# Patient Record
Sex: Female | Born: 1955 | Race: White | Hispanic: No
Health system: Southern US, Community
[De-identification: ages and names within clinical notes are randomized; demographics above are authoritative.]

---

## 1998-03-14 ENCOUNTER — Encounter: Admission: RE | Admit: 1998-03-14 | Discharge: 1998-06-12 | Payer: Self-pay | Admitting: Anesthesiology

## 1998-04-23 ENCOUNTER — Encounter: Admission: RE | Admit: 1998-04-23 | Discharge: 1998-04-23 | Payer: Self-pay | Admitting: Internal Medicine

## 1998-06-28 ENCOUNTER — Encounter: Admission: RE | Admit: 1998-06-28 | Discharge: 1998-09-14 | Payer: Self-pay | Admitting: Anesthesiology

## 1998-09-14 ENCOUNTER — Encounter: Admission: RE | Admit: 1998-09-14 | Discharge: 1998-12-13 | Payer: Self-pay | Admitting: Anesthesiology

## 1998-10-15 ENCOUNTER — Ambulatory Visit (HOSPITAL_COMMUNITY): Admission: RE | Admit: 1998-10-15 | Discharge: 1998-10-15 | Payer: Self-pay | Admitting: Anesthesiology

## 1998-12-13 ENCOUNTER — Encounter: Admission: RE | Admit: 1998-12-13 | Discharge: 1999-03-08 | Payer: Self-pay | Admitting: Anesthesiology

## 1999-03-08 ENCOUNTER — Encounter: Admission: RE | Admit: 1999-03-08 | Discharge: 1999-05-30 | Payer: Self-pay | Admitting: Anesthesiology

## 1999-05-30 ENCOUNTER — Encounter: Admission: RE | Admit: 1999-05-30 | Discharge: 1999-08-23 | Payer: Self-pay | Admitting: Anesthesiology

## 1999-07-04 ENCOUNTER — Encounter: Admission: RE | Admit: 1999-07-04 | Discharge: 1999-07-04 | Payer: Self-pay | Admitting: Internal Medicine

## 1999-08-23 ENCOUNTER — Encounter: Admission: RE | Admit: 1999-08-23 | Discharge: 1999-11-21 | Payer: Self-pay | Admitting: Anesthesiology

## 1999-09-12 ENCOUNTER — Encounter: Payer: Self-pay | Admitting: Internal Medicine

## 1999-09-12 ENCOUNTER — Ambulatory Visit (HOSPITAL_COMMUNITY): Admission: RE | Admit: 1999-09-12 | Discharge: 1999-09-12 | Payer: Self-pay | Admitting: Internal Medicine

## 1999-09-12 ENCOUNTER — Encounter: Admission: RE | Admit: 1999-09-12 | Discharge: 1999-09-12 | Payer: Self-pay | Admitting: Internal Medicine

## 1999-09-20 ENCOUNTER — Encounter: Admission: RE | Admit: 1999-09-20 | Discharge: 1999-09-20 | Payer: Self-pay | Admitting: Internal Medicine

## 1999-12-27 ENCOUNTER — Encounter: Admission: RE | Admit: 1999-12-27 | Discharge: 2000-02-19 | Payer: Self-pay | Admitting: Anesthesiology

## 2000-02-18 ENCOUNTER — Encounter: Admission: RE | Admit: 2000-02-18 | Discharge: 2000-05-18 | Payer: Self-pay | Admitting: Anesthesiology

## 2000-03-16 ENCOUNTER — Encounter: Admission: RE | Admit: 2000-03-16 | Discharge: 2000-03-16 | Payer: Self-pay | Admitting: Internal Medicine

## 2000-03-16 ENCOUNTER — Ambulatory Visit (HOSPITAL_COMMUNITY): Admission: RE | Admit: 2000-03-16 | Discharge: 2000-03-16 | Payer: Self-pay | Admitting: *Deleted

## 2000-04-10 ENCOUNTER — Encounter: Admission: RE | Admit: 2000-04-10 | Discharge: 2000-04-10 | Payer: Self-pay | Admitting: Internal Medicine

## 2000-06-16 ENCOUNTER — Encounter: Admission: RE | Admit: 2000-06-16 | Discharge: 2000-09-14 | Payer: Self-pay | Admitting: Anesthesiology

## 2000-10-22 ENCOUNTER — Encounter: Admission: RE | Admit: 2000-10-22 | Discharge: 2001-01-20 | Payer: Self-pay | Admitting: Anesthesiology

## 2000-12-03 ENCOUNTER — Emergency Department (HOSPITAL_COMMUNITY): Admission: EM | Admit: 2000-12-03 | Discharge: 2000-12-04 | Payer: Self-pay | Admitting: Emergency Medicine

## 2000-12-03 ENCOUNTER — Encounter: Payer: Self-pay | Admitting: Emergency Medicine

## 2000-12-09 ENCOUNTER — Encounter: Admission: RE | Admit: 2000-12-09 | Discharge: 2000-12-09 | Payer: Self-pay | Admitting: Hematology and Oncology

## 2000-12-30 ENCOUNTER — Encounter: Admission: RE | Admit: 2000-12-30 | Discharge: 2000-12-30 | Payer: Self-pay | Admitting: Hematology and Oncology

## 2001-01-14 ENCOUNTER — Encounter: Admission: RE | Admit: 2001-01-14 | Discharge: 2001-04-14 | Payer: Self-pay | Admitting: Anesthesiology

## 2001-01-19 ENCOUNTER — Encounter: Admission: RE | Admit: 2001-01-19 | Discharge: 2001-01-19 | Payer: Self-pay | Admitting: *Deleted

## 2001-04-20 ENCOUNTER — Encounter: Admission: RE | Admit: 2001-04-20 | Discharge: 2001-06-05 | Payer: Self-pay | Admitting: Anesthesiology

## 2001-05-17 ENCOUNTER — Encounter: Admission: RE | Admit: 2001-05-17 | Discharge: 2001-06-15 | Payer: Self-pay | Admitting: Orthopedic Surgery

## 2001-05-28 ENCOUNTER — Encounter: Admission: RE | Admit: 2001-05-28 | Discharge: 2001-05-28 | Payer: Self-pay | Admitting: Internal Medicine

## 2002-08-24 ENCOUNTER — Ambulatory Visit (HOSPITAL_BASED_OUTPATIENT_CLINIC_OR_DEPARTMENT_OTHER): Admission: RE | Admit: 2002-08-24 | Discharge: 2002-08-24 | Payer: Self-pay | Admitting: Orthopedic Surgery

## 2014-10-02 ENCOUNTER — Emergency Department: Payer: Self-pay | Admitting: Emergency Medicine

## 2014-10-02 LAB — CBC
HCT: 33.9 % — ABNORMAL LOW (ref 35.0–47.0)
HGB: 10.6 g/dL — ABNORMAL LOW (ref 12.0–16.0)
MCH: 28 pg (ref 26.0–34.0)
MCHC: 31.4 g/dL — ABNORMAL LOW (ref 32.0–36.0)
MCV: 89 fL (ref 80–100)
Platelet: 294 10*3/uL (ref 150–440)
RBC: 3.8 10*6/uL (ref 3.80–5.20)
RDW: 22.1 % — ABNORMAL HIGH (ref 11.5–14.5)
WBC: 12.8 10*3/uL — ABNORMAL HIGH (ref 3.6–11.0)

## 2014-10-02 LAB — BASIC METABOLIC PANEL
Anion Gap: 15 (ref 7–16)
BUN: 9 mg/dL (ref 7–18)
Calcium, Total: 7.6 mg/dL — ABNORMAL LOW (ref 8.5–10.1)
Chloride: 109 mmol/L — ABNORMAL HIGH (ref 98–107)
Co2: 18 mmol/L — ABNORMAL LOW (ref 21–32)
Creatinine: 1.05 mg/dL (ref 0.60–1.30)
EGFR (African American): >60
EGFR (Non-African Amer.): 58 — ABNORMAL LOW
Glucose: 122 mg/dL — ABNORMAL HIGH (ref 65–99)
Osmolality: 283 (ref 275–301)
Potassium: 2.8 mmol/L — ABNORMAL LOW (ref 3.5–5.1)
Sodium: 142 mmol/L (ref 136–145)

## 2014-10-02 LAB — URINALYSIS, COMPLETE
Bilirubin,UR: NEGATIVE
Blood: NEGATIVE
Glucose,UR: NEGATIVE mg/dL (ref 0–75)
Hyaline Cast: 14
Ketone: NEGATIVE
Leukocyte Esterase: NEGATIVE
Nitrite: NEGATIVE
Ph: 5 (ref 4.5–8.0)
Protein: NEGATIVE
RBC,UR: NONE SEEN /HPF (ref 0–5)
Specific Gravity: 1.017 (ref 1.003–1.030)
Squamous Epithelial: <1
WBC UR: 1 /HPF (ref 0–5)

## 2014-10-02 LAB — DRUG SCREEN, URINE

## 2014-10-02 LAB — ETHANOL: Ethanol: 5 mg/dL

## 2014-10-02 LAB — TROPONIN I: Troponin-I: <0.02 ng/mL

## 2014-10-03 ENCOUNTER — Inpatient Hospital Stay: Payer: Self-pay | Admitting: Internal Medicine

## 2014-10-03 LAB — ETHANOL: Ethanol: <3 mg/dL

## 2014-10-03 LAB — CBC
HCT: 35 % (ref 35.0–47.0)
HGB: 10.8 g/dL — ABNORMAL LOW (ref 12.0–16.0)
MCH: 28.4 pg (ref 26.0–34.0)
MCHC: 31 g/dL — ABNORMAL LOW (ref 32.0–36.0)
MCV: 92 fL (ref 80–100)
PLATELETS: 262 10*3/uL (ref 150–440)
RBC: 3.81 10*6/uL (ref 3.80–5.20)
RDW: 22.4 % — AB (ref 11.5–14.5)
WBC: 24 10*3/uL — AB (ref 3.6–11.0)

## 2014-10-03 LAB — DRUG SCREEN, URINE
AMPHETAMINES, UR SCREEN: NEGATIVE (ref ?–1000)
BARBITURATES, UR SCREEN: NEGATIVE (ref ?–200)
Benzodiazepine, Ur Scrn: NEGATIVE (ref ?–200)
Cannabinoid 50 Ng, Ur ~~LOC~~: NEGATIVE (ref ?–50)
Cocaine Metabolite,Ur ~~LOC~~: NEGATIVE (ref ?–300)
MDMA (ECSTASY) UR SCREEN: NEGATIVE (ref ?–500)
Methadone, Ur Screen: NEGATIVE (ref ?–300)
OPIATE, UR SCREEN: NEGATIVE (ref ?–300)
PHENCYCLIDINE (PCP) UR S: NEGATIVE (ref ?–25)
TRICYCLIC, UR SCREEN: NEGATIVE (ref ?–1000)

## 2014-10-03 LAB — URINALYSIS, COMPLETE
Bacteria: NONE SEEN
Bilirubin,UR: NEGATIVE
Blood: NEGATIVE
GLUCOSE, UR: NEGATIVE mg/dL (ref 0–75)
KETONE: NEGATIVE
NITRITE: NEGATIVE
Ph: 5 (ref 4.5–8.0)
Protein: NEGATIVE
RBC,UR: 1 /HPF (ref 0–5)
Specific Gravity: 1.016 (ref 1.003–1.030)
Transitional Epi: <1
WBC UR: 4 /HPF (ref 0–5)

## 2014-10-03 LAB — COMPREHENSIVE METABOLIC PANEL
ALBUMIN: 2.8 g/dL — AB (ref 3.4–5.0)
Alkaline Phosphatase: 82 U/L
Anion Gap: 11 (ref 7–16)
BILIRUBIN TOTAL: 1.5 mg/dL — AB (ref 0.2–1.0)
BUN: 12 mg/dL (ref 7–18)
CALCIUM: 7.9 mg/dL — AB (ref 8.5–10.1)
Chloride: 112 mmol/L — ABNORMAL HIGH (ref 98–107)
Co2: 20 mmol/L — ABNORMAL LOW (ref 21–32)
Creatinine: 1.16 mg/dL (ref 0.60–1.30)
GFR CALC NON AF AMER: 51 — AB
GLUCOSE: 100 mg/dL — AB (ref 65–99)
OSMOLALITY: 285 (ref 275–301)
Potassium: 3.4 mmol/L — ABNORMAL LOW (ref 3.5–5.1)
SGOT(AST): 195 U/L — ABNORMAL HIGH (ref 15–37)
SGPT (ALT): 67 U/L — ABNORMAL HIGH
SODIUM: 143 mmol/L (ref 136–145)
TOTAL PROTEIN: 6.4 g/dL (ref 6.4–8.2)

## 2014-10-03 LAB — ACETAMINOPHEN LEVEL: Acetaminophen: 36 ug/mL — ABNORMAL HIGH

## 2014-10-03 LAB — SALICYLATE LEVEL: Salicylates, Serum: <1.7 mg/dL

## 2014-10-03 LAB — CK-MB: CK-MB: 1.6 ng/mL (ref 0.5–3.6)

## 2014-10-03 LAB — CK: CK, Total: 162 U/L (ref 26–192)

## 2014-10-03 LAB — TROPONIN I: Troponin-I: <0.02 ng/mL

## 2014-10-04 LAB — COMPREHENSIVE METABOLIC PANEL
ANION GAP: 14 (ref 7–16)
AST: 1964 U/L — AB (ref 15–37)
Albumin: 2.3 g/dL — ABNORMAL LOW (ref 3.4–5.0)
Alkaline Phosphatase: 52 U/L
BILIRUBIN TOTAL: 3.4 mg/dL — AB (ref 0.2–1.0)
BUN: 12 mg/dL (ref 7–18)
CO2: 16 mmol/L — AB (ref 21–32)
Calcium, Total: 7.1 mg/dL — ABNORMAL LOW (ref 8.5–10.1)
Chloride: 111 mmol/L — ABNORMAL HIGH (ref 98–107)
Creatinine: 1.34 mg/dL — ABNORMAL HIGH (ref 0.60–1.30)
EGFR (African American): 53 — ABNORMAL LOW
EGFR (Non-African Amer.): 43 — ABNORMAL LOW
Glucose: 34 mg/dL — CL (ref 65–99)
Osmolality: 277 (ref 275–301)
Potassium: 3.4 mmol/L — ABNORMAL LOW (ref 3.5–5.1)
SGPT (ALT): 583 U/L — ABNORMAL HIGH
Sodium: 141 mmol/L (ref 136–145)
Total Protein: 5 g/dL — ABNORMAL LOW (ref 6.4–8.2)

## 2014-10-04 LAB — CBC WITH DIFFERENTIAL/PLATELET
BASOS ABS: 0.1 10*3/uL (ref 0.0–0.1)
Basophil %: 0.2 %
EOS ABS: 0 10*3/uL (ref 0.0–0.7)
EOS PCT: 0.1 %
HCT: 33.6 % — ABNORMAL LOW (ref 35.0–47.0)
HGB: 10.3 g/dL — AB (ref 12.0–16.0)
LYMPHS ABS: 0.5 10*3/uL — AB (ref 1.0–3.6)
Lymphocyte %: 2.1 %
MCH: 28.5 pg (ref 26.0–34.0)
MCHC: 30.8 g/dL — ABNORMAL LOW (ref 32.0–36.0)
MCV: 93 fL (ref 80–100)
MONOS PCT: 0.9 %
Monocyte #: 0.2 x10 3/mm (ref 0.2–0.9)
NEUTROS ABS: 25.2 10*3/uL — AB (ref 1.4–6.5)
NEUTROS PCT: 96.7 %
PLATELETS: 235 10*3/uL (ref 150–440)
RBC: 3.62 10*6/uL — ABNORMAL LOW (ref 3.80–5.20)
RDW: 23.4 % — AB (ref 11.5–14.5)
WBC: 26.1 10*3/uL — ABNORMAL HIGH (ref 3.6–11.0)

## 2014-10-06 LAB — CBC WITH DIFFERENTIAL/PLATELET
BASOS ABS: 0 10*3/uL (ref 0.0–0.1)
Basophil %: 0.1 %
EOS PCT: 1 %
Eosinophil #: 0.2 10*3/uL (ref 0.0–0.7)
HCT: 30.6 % — ABNORMAL LOW (ref 35.0–47.0)
HGB: 9.6 g/dL — ABNORMAL LOW (ref 12.0–16.0)
LYMPHS PCT: 3.5 %
Lymphocyte #: 0.5 10*3/uL — ABNORMAL LOW (ref 1.0–3.6)
MCH: 28.7 pg (ref 26.0–34.0)
MCHC: 31.4 g/dL — ABNORMAL LOW (ref 32.0–36.0)
MCV: 92 fL (ref 80–100)
MONOS PCT: 3 %
Monocyte #: 0.4 x10 3/mm (ref 0.2–0.9)
NEUTROS PCT: 92.4 %
Neutrophil #: 13.7 10*3/uL — ABNORMAL HIGH (ref 1.4–6.5)
Platelet: 206 10*3/uL (ref 150–440)
RBC: 3.34 10*6/uL — ABNORMAL LOW (ref 3.80–5.20)
RDW: 23.6 % — AB (ref 11.5–14.5)
WBC: 14.9 10*3/uL — AB (ref 3.6–11.0)

## 2014-10-06 LAB — BASIC METABOLIC PANEL
Anion Gap: 7 (ref 7–16)
BUN: 14 mg/dL (ref 7–18)
Calcium, Total: 7.4 mg/dL — ABNORMAL LOW (ref 8.5–10.1)
Chloride: 114 mmol/L — ABNORMAL HIGH (ref 98–107)
Co2: 21 mmol/L (ref 21–32)
Creatinine: 1.16 mg/dL (ref 0.60–1.30)
EGFR (African American): >60
GFR CALC NON AF AMER: 51 — AB
GLUCOSE: 145 mg/dL — AB (ref 65–99)
Osmolality: 286 (ref 275–301)
Potassium: 3.7 mmol/L (ref 3.5–5.1)
Sodium: 142 mmol/L (ref 136–145)

## 2014-10-08 LAB — CULTURE, BLOOD (SINGLE)

## 2014-10-09 LAB — CBC WITH DIFFERENTIAL/PLATELET
BANDS NEUTROPHIL: 4 %
HCT: 30 % — ABNORMAL LOW (ref 35.0–47.0)
HGB: 9.7 g/dL — ABNORMAL LOW (ref 12.0–16.0)
LYMPHS PCT: 18 %
MCH: 28.7 pg (ref 26.0–34.0)
MCHC: 32.4 g/dL (ref 32.0–36.0)
MCV: 89 fL (ref 80–100)
Metamyelocyte: 1 %
Monocytes: 6 %
NRBC/100 WBC: 3 /
PLATELETS: 176 10*3/uL (ref 150–440)
RBC: 3.39 10*6/uL — AB (ref 3.80–5.20)
RDW: 23.4 % — AB (ref 11.5–14.5)
SEGMENTED NEUTROPHILS: 71 %
WBC: 8.2 10*3/uL (ref 3.6–11.0)

## 2014-10-09 LAB — BASIC METABOLIC PANEL
Anion Gap: 8 (ref 7–16)
BUN: 18 mg/dL (ref 7–18)
CALCIUM: 8 mg/dL — AB (ref 8.5–10.1)
Chloride: 103 mmol/L (ref 98–107)
Co2: 34 mmol/L — ABNORMAL HIGH (ref 21–32)
Creatinine: 0.87 mg/dL (ref 0.60–1.30)
EGFR (African American): >60
Glucose: 130 mg/dL — ABNORMAL HIGH (ref 65–99)
OSMOLALITY: 292 (ref 275–301)
Potassium: 2.6 mmol/L — ABNORMAL LOW (ref 3.5–5.1)
Sodium: 145 mmol/L (ref 136–145)

## 2014-10-09 LAB — MAGNESIUM: MAGNESIUM: 1.4 mg/dL — AB

## 2014-10-11 DIAGNOSIS — I340 Nonrheumatic mitral (valve) insufficiency: Secondary | ICD-10-CM

## 2014-10-11 LAB — BASIC METABOLIC PANEL
ANION GAP: 9 (ref 7–16)
Anion Gap: 7 (ref 7–16)
BUN: 17 mg/dL (ref 7–18)
BUN: 15 mg/dL (ref 7–18)
CO2: 39 mmol/L — AB (ref 21–32)
Calcium, Total: 7.3 mg/dL — ABNORMAL LOW (ref 8.5–10.1)
Calcium, Total: 7.8 mg/dL — ABNORMAL LOW (ref 8.5–10.1)
Chloride: 91 mmol/L — ABNORMAL LOW (ref 98–107)
Chloride: 90 mmol/L — ABNORMAL LOW (ref 98–107)
Co2: 40 mmol/L (ref 21–32)
Creatinine: 1.15 mg/dL (ref 0.60–1.30)
Creatinine: 0.98 mg/dL (ref 0.60–1.30)
EGFR (African American): >60
EGFR (Non-African Amer.): >60
GFR CALC NON AF AMER: 52 — AB
GLUCOSE: 137 mg/dL — AB (ref 65–99)
Glucose: 157 mg/dL — ABNORMAL HIGH (ref 65–99)
OSMOLALITY: 283 (ref 275–301)
Osmolality: 276 (ref 275–301)
Potassium: 2.2 mmol/L — CL (ref 3.5–5.1)
Potassium: 3 mmol/L — ABNORMAL LOW (ref 3.5–5.1)
SODIUM: 136 mmol/L (ref 136–145)
Sodium: 140 mmol/L (ref 136–145)

## 2014-10-11 LAB — MAGNESIUM
MAGNESIUM: 1.1 mg/dL — AB
Magnesium: 1.7 mg/dL — ABNORMAL LOW

## 2014-10-12 LAB — BASIC METABOLIC PANEL
Anion Gap: 6 — ABNORMAL LOW (ref 7–16)
BUN: 15 mg/dL (ref 7–18)
CHLORIDE: 92 mmol/L — AB (ref 98–107)
CREATININE: 0.91 mg/dL (ref 0.60–1.30)
Calcium, Total: 8.1 mg/dL — ABNORMAL LOW (ref 8.5–10.1)
Co2: 39 mmol/L — ABNORMAL HIGH (ref 21–32)
EGFR (African American): >60
EGFR (Non-African Amer.): >60
Glucose: 109 mg/dL — ABNORMAL HIGH (ref 65–99)
Osmolality: 275 (ref 275–301)
Potassium: 2.8 mmol/L — ABNORMAL LOW (ref 3.5–5.1)
Sodium: 137 mmol/L (ref 136–145)

## 2014-10-12 LAB — MAGNESIUM: Magnesium: 1.8 mg/dL

## 2014-10-12 LAB — PLATELET COUNT: Platelet: 299 10*3/uL

## 2014-10-13 LAB — BASIC METABOLIC PANEL
ANION GAP: 6 — AB (ref 7–16)
BUN: 15 mg/dL (ref 7–18)
CO2: 36 mmol/L — AB (ref 21–32)
CREATININE: 0.84 mg/dL (ref 0.60–1.30)
Calcium, Total: 8.4 mg/dL — ABNORMAL LOW (ref 8.5–10.1)
Chloride: 96 mmol/L — ABNORMAL LOW (ref 98–107)
Glucose: 107 mg/dL — ABNORMAL HIGH (ref 65–99)
Osmolality: 277 (ref 275–301)
Potassium: 3.6 mmol/L (ref 3.5–5.1)
Sodium: 138 mmol/L (ref 136–145)

## 2014-10-14 LAB — BASIC METABOLIC PANEL
Anion Gap: 7 (ref 7–16)
BUN: 16 mg/dL (ref 7–18)
CHLORIDE: 94 mmol/L — AB (ref 98–107)
CREATININE: 1.01 mg/dL (ref 0.60–1.30)
Calcium, Total: 8.9 mg/dL (ref 8.5–10.1)
Co2: 33 mmol/L — ABNORMAL HIGH (ref 21–32)
EGFR (African American): >60
GLUCOSE: 160 mg/dL — AB (ref 65–99)
Osmolality: 273 (ref 275–301)
POTASSIUM: 3.7 mmol/L (ref 3.5–5.1)
Sodium: 134 mmol/L — ABNORMAL LOW (ref 136–145)

## 2015-01-27 NOTE — Consult Note (Signed)
Brief Consult Note: Diagnosis: alcohol abuse.   Patient was seen by consultant.   Consult note dictated.   Recommend further assessment or treatment.   Orders entered.   Comments: Psychiatry: PAtient seen and chart reviewed. Patient with alcohol abuse and possible DTs vs regular alcohol withdrawl. Orders done for standinglibrium in addition to detox orders. Counceling done. REquest SW assist with referralto outpt SA care after discharge.  Electronic Signatures: Clapacs, Jackquline DenmarkJohn T (MD)  (Signed 30-Dec-15 16:38)  Authored: Brief Consult Note   Last Updated: 30-Dec-15 16:38 by Audery Amellapacs, John T (MD)

## 2015-01-31 NOTE — H&P (Signed)
PATIENT NAME:  Rachel, Buchanan MR#:  [id number] DATE OF BIRTH:  09/14/1956  PRIMARY CARE PHYSICIAN:  From out [place] in [place].  HISTORY OF PRESENT ILLNESS:  Rachel Buchanan is a 59 year old Caucasian female with past medical history of coronary artery disease, status post stent, history of ongoing alcohol abuse, and tobacco abuse.  Comes to the Emergency Room yesterday for getting admitted for detoxification.  She left the ER saying she will come back after she gets her belongings from home.  She showed up today.  She was in the process of getting evaluated by behavioral medicine; however, in the process of getting that done, her routine laboratories showed her white count was elevated to 24,000.  She had been complaining of shortness of breath and cough and CT of the chest was done which shows bilateral pneumonia, more in the middle lobe, both on the left and right, likely aspiration in the setting of alcohol abuse, and is being admitted on the medical service. The patient will be continued on CIWA protocol and receive IV antibiotics.  Her white count was 24,000.   PAST MEDICAL HISTORY:   1.  Coronary artery disease, status post stent placement x 1 in [place] in September 2015.   2.  Ongoing alcohol abuse. 3.  Tobacco abuse. The patient was counseled on smoking cessation, about 4 minutes spent. Does not seem patient is motivated.   4.  Hypertension. 5.  Hypothyroidism.  ALLERGIES:  ANAPROX.  HOME MEDICATIONS:  1.  Synthroid 112 mcg p.o. daily. 2.  Protonix 40 mg p.o. b.i.d.  3.  Sucralfate 1 gram p.o. t.i.d.  4.  Docusate 100 mg b.i.d.  5.  Breo Ellipta 100/25 mcg 1 puff daily. 6.  Albuterol oral inhaler 2 puffs every 4 hours as needed. 7.  Hydroxyzine hydrochloride 25 mg 1 tablet 4 times a day. 8.  Lisinopril 2.5 mg p.o. daily.  SOCIAL HISTORY:  She lives with her ex-husband.  Smokes about 1/2 pack a day. Drinks alcohol, about 3-4 beers and hard liquor on a daily basis.  She is not  able to quantitate her hard liquor amount.  She denies any other street drug use.  FAMILY HISTORY:  Positive for hypertension.  REVIEW OF SYSTEMS:  CONSTITUTIONAL:  No fever.  Positive  for fatigue, weakness. EYES:  No blurred or double vision, glaucoma, or cataract.  EARS, NOSE, THROAT:  No tinnitus, ear pain, hearing loss. RESPIRATORY:  Positive for cough, shortness of breath.  No hemoptysis or painful respiration. CARDIOVASCULAR:  No chest pain, orthopnea, edema.  Positive for dyspnea on exertion. GASTROINTESTINAL: No nausea, vomiting, diarrhea.  Positive for GERD. No rectal bleed. GENITOURINARY: No dysuria, hematuria, or frequency.  ENDOCRINE: No polyuria, nocturia, or thyroid problems.  HEMATOLOGY: No anemia. Positive for bruises over the hands.  SKIN: No acne or rash or lesion. MUSCULOSKELETAL: Positive for arthritis, no swelling or gout.  NEUROLOGIC: No CVA, TIA.  Positive for generalized weakness.  PSYCHIATRIC: Positive for anxiety. No depression or bipolar disorder.  All other systems reviewed and negative.   PHYSICAL EXAMINATION: GENERAL: The patient is awake, alert, oriented x 2, not in acute distress.  VITAL SIGNS:  Afebrile.  Pulse is 93. Respirations 24.  Blood pressure is 119/77, saturations are 99% on room air. HEENT: Atraumatic, normocephalic. PERRLA, EOM intact. Oral mucosa is dry.  Patient is edentulous. NECK: Supple. No JVD. No carotid bruit.  RESPIRATORY:  There are decreased breath sounds and coarse breath sounds bilaterally.  No rales, rhonchi,  respiratory distress, or labored breathing.  CARDIOVASCULAR:  Both the heart sounds are normal. Tachycardia present. No murmur heard. PMI not lateralized. Chest nontender.  EXTREMITIES: Good pedal pulses. Good femoral pulses. No lower extremity edema.  ABDOMEN: Obese, soft, nontender.  No organomegaly.  Positive bowel sounds.  NEUROLOGIC: Grossly intact cranial nerves 2 through 12. No motor or sensory deficits.   PSYCHIATRIC:  Patient is awake, alert, oriented x 2.   LABORATORY DATA:  Urine drug screen is negative.  UA negative for UTI.  White count is 24,000.  H and H is 10.8 and 35.0.  Glucose is 100, BUN is 12, creatinine is 1.16.  Sodium is 143, potassium 3.4, chloride is 112, bicarbonate is 20.  Bilirubin is 1.5, SGPT is 67, SGOT is 195, albumin is 2.8.  Acetaminophen is 36.  Ethanol is less than 3.  Cardiac enzymes are negative.    EKG shows normal sinus rhythm, nonspecific ST-T changes.  CT of the chest shows bilateral pneumonia with large area of dense consolidation involving the lingula and smaller right middle lobe infiltrate.  Coronary atherosclerosis with calcified plaque in the distribution of LAD and left circumflex coronary arteries.  Thyroid goiter.  Moderate hiatal hernia.  Hepatic steatosis.  CT of the head that was done yesterday shows no acute intracranial abnormality.  ASSESSMENT:  Rachel Buchanan, a 59 year old, with history of chronic alcohol abuse, chronic tobacco abuse, history of coronary artery disease, status post stent in September 2015, comes in with: 1.  Sepsis secondary to bilateral pneumonia, most likely aspiration, in the setting of alcohol intoxication/alcohol abuse. The patient will be admitted on the medical floor.  We will keep her n.p.o. except medications and ice chips until speech evaluates her for possibility of aspiration.  Will start her on intravenous Zosyn and Zithromax to cover the anaerobes and broad spectrum at this time.  Follow up blood cultures and sputum culture, incentive spirometry and DuoNeb around the clock. 2.  Elevated transaminases suspected secondary to alcohol-induced hepatitis. Will continue to monitor her liver function tests.  Consider ultrasound of the abdomen if needed. 3.  Chronic alcohol abuse. The patient is here for detoxification. We will put her on CIWA protocol.  Inpatient psychiatric consult has been placed.  4.  Chronic tobacco abuse.  Smoking cessation advised, about 3 minutes spent, not sure patient is motivated. 5.  Leukocytosis due to sepsis from bilateral pneumonia. 6.  Deep venous thrombosis prophylaxis, subcutaneous heparin t.i.d.   Above was discussed with patient. No family members were present.  TIME SPENT:  50 minutes.   ____________________________ Wylie HailSona A. Allena KatzPatel, MD sap:LT D: 10/03/2014 19:20:06 ET T: 10/03/2014 19:34:58 ET JOB#: [id number]  cc: Lainy Wrobleski A. Allena KatzPatel, MD, <Dictator> Willow OraSONA A Margretta Zamorano MD ELECTRONICALLY SIGNED 10/10/2014 11:12

## 2015-01-31 NOTE — Consult Note (Signed)
PATIENT NAME:  Dan EuropeSTAPLETON, Rachel L MR#:  [id number] DATE OF BIRTH:  01-21-1956  DATE OF CONSULTATION:  10/04/2014  REFERRING PHYSICIAN:   CONSULTING PHYSICIAN:  Audery AmelJohn T. Clapacs, MD  IDENTIFYING INFORMATION AND REASON FOR CONSULTATION: A 59 year old woman currently in the hospital primarily for pneumonia. Consultation is for alcohol abuse.   HISTORY OF PRESENT ILLNESS: Information is obtained from the patient and the chart. The patient came into the Emergency Room initially with some anxiety concerns. Medical problems were picked up in the course of her evaluation and it was determined that she had a pneumonia. The patient tells me that she is still feeling pretty rough today. She is feeling jittery and a little sick to her stomach. Feels like her head is spinning and gets dizzy when she turns it to the side. She is tired out and she is having a shake all over. She is having trouble breathing and is coughing quite a bit. She denies having any suicidal or homicidal ideation. She denies having any auditory, visual or tactile hallucinations. She indicates that she has been drinking about 4 beers a day, although when pressed admits that she will drink liquor on top of that. The time course is a little unclear, but it sounds like she is on and off had alcohol problems for many years but has also been able to have long periods of sobriety. The current time of drinking sounds like it has been going on for about a year and a half, since living with the man [place] is currently with in [place]. She denies that she abuses any other drugs. She says that she wants to stop drinking because she knows it is causing problems for her health and making it hard for her to reunite with her adult children and her grandchildren.   PAST PSYCHIATRIC HISTORY: She has been prescribed Zoloft in the past, but says that that was done just to try and treat her chronic tremors. She thinks it made her mood worse. She denies any psychiatric  hospitalization and denies any history of suicide attempts in the past. She denies any other psychiatric medicine.   SUBSTANCE ABUSE TREATMENT: Denies ever being in a hospital or other facility for detoxification or any rehabilitation treatment. Most of her sobriety seems to have been done on her own initiative. She denies any history of seizures from alcohol withdrawal, but thinks she may have had something like delirium tremens.   PAST MEDICAL HISTORY: The patient currently has a bilateral pneumonia and is being treated in the hospital. She has hypothyroidism as well. Gastric reflux symptoms.   FAMILY HISTORY: Had a grandmother who had an alcohol problem. No other mental health problems in the family.   CURRENT MEDICATIONS: At the time of admission, her medicine list was Synthroid 112 mcg per day, Protonix 40 mg twice a day, sucralfate 1 gram 3 times a day, docusate 100 mg twice a day, Breo Ellipta 100/25 mcg 1 puff daily, albuterol inhaler as needed, hydroxyzine 25 mg 4 times a day, lisinopril 2.5 mg daily.   ALLERGIES: ANAPROX.  REVIEW OF SYSTEMS: Anxious. Feeling dizzy. Shaking all over. Sick to her stomach. Denies suicidal or homicidal ideation. Denies hallucinations.   MENTAL STATUS EXAMINATION: This is a somewhat disheveled woman, looks her stated age, cooperative with the interview. Eye contact intermittent. Psychomotor activity is marked by remarkable tremor all over her body and a tremor to her speech. Speech itself is slow, decreased in amount and sometimes hard to understand because  of her wheezing and coughing. Affect is mildly anxious but reactive. Mood is stated as being okay. Thoughts are lucid without any loosening of associations or delusions. Denies hallucinations. Denies suicidal or homicidal ideation. Could repeat 3/3 objects immediately, remembered zero them at 3 minutes. She was alert and oriented x 4 however, and did not appear to be obviously waxing and waning in her mental  state. Judgment and insight appear to be adequate.   LABORATORY RESULTS: Alcohol level on admission was 5. Drug screen positive only for benzodiazepines. Urinalysis unremarkable. She did have an elevated glucose 122, low potassium 2.8. The white count on the 29th was 24, which I believe is what tipped off the possibility of her pneumonia. She had a chest angiography CT which confirmed the pneumonia.   VITAL SIGNS: Currently, blood pressure 93/60, respirations 20, pulse 92, temperature 97.4.   ASSESSMENT: A 59 year old woman with a history of alcohol abuse, possible delirium tremens. Right now she is certainly shaking all over, but I do not think that she is really showing signs of delirium. Her blood pressure and pulse are stable. Complicating all this is the pneumonia, which could be also a cause of the tremor. It sounds like tremors have been a chronic problem for her as well. The patient is motivated to try and stop drinking out into the future. No sign of suicidality.   TREATMENT PLAN: Psychoeducation completed regarding substance abuse treatment and detoxification. I have added some standing Librium for the next couple of days to help control any withdrawal in addition to the detoxification protocol in place. I have put in a social work consult to request referral for outpatient substance abuse treatment here in the community at discharge. No need for inpatient hospitalization. The patient can be discharged when she is ready.   DIAGNOSIS, PRINCIPAL AND PRIMARY:  AXIS I: Alcohol abuse, moderate.   SECONDARY DIAGNOSES:  AXIS I: Anxiety, secondary to alcohol withdrawal.  AXIS II: Deferred.  AXIS III: Pneumonia.   ____________________________ Audery Amel, MD jtc:TT D: 10/04/2014 16:58:22 ET T: 10/04/2014 17:20:16 ET JOB#: [id number]  cc: Audery Amel, MD, <Dictator> Audery Amel MD ELECTRONICALLY SIGNED 10/09/2014 15:16

## 2015-02-04 NOTE — Discharge Summary (Signed)
PATIENT NAME:  Rachel Buchanan, Shatarra L MR#:  [id number] DATE OF BIRTH:  04-19-1956  DATE OF ADMISSION:  10/03/2014 DATE OF DISCHARGE:  10/14/2014  DISCHARGE DIAGNOSES:  1.  Acute respiratory failure. 2.  Bilateral pneumonia.  3.  Sepsis due to bilateral pneumonia.  4.  Alcohol abuse with delirium tremens.  5.  Hypokalemia.  6.  Chronic obstructive pulmonary disease exacerbation.   DISCHARGE MEDICATIONS: Lisinopril 2.5 mg daily, pantoprazole 40 mg p.o. b.i.d., sucralfate 1 gram p.o. t.i.d., Colace 100 mg p.o. b.i.d., Breo Ellipta 200/25 mcg inhalations 1 puff daily, hydroxyzine/hydrochloride 25 mg p.o. 4 time day, Synthroid 25 mcg p.o. daily, Xanax 0.25 mg every 8 hours as needed for anxiety,  , Lasix 40 mg p.o. daily as needed for swelling of legs, Advair 250/50 one puff b.i.d., Spiriva 1 capsule inhalation daily,   Augmentin m875/125 mg po BID for 10 days prednisone dose  taper as per precritption  HOME OXYGEN: None.  CONSULTATIONS: None.   previous > interim discharge summary done by Dr. Auburn BilberryShreyang Patel January 8. I have reviewed the discharge summary and also the previous notes.   HOSPITAL COURSE: This patient is a 59 year old female patient visiting from [place] came in for detoxification, but she had trouble breathing and cough. CT chest showed pneumonia. White count was 24,000 so she was admitted to medicine. Started her on oxygen along with steroids and nebulizers. The patient's pneumonia improved nicely. She was able to come off oxygen. Lungs were clear. CT chest did not show any pulmonary embolus.  The patient blood cultures did not show any growth. The patient is discharged home in stable condition. The patient's renal function stayed stable and echocardiogram showed EF60 to 65%. The patient's repeat chest x-ray showed improved aeration in the lungs.     DISCHARGE PHYSICAL EXAMINATION:  VITAL SIGNS: Temperature 97.6, heart rate 70, blood pressure 131/87, saturations (95>% on room air on  exertion she was  above 90% on RA.so she did not qualify for home o2.Marland Kitchen.   GENERAL: The patient was alert, awake, oriented.  CARDIOVASCULAR: S1, S2 regular,no mumur. LUNGS: Clear to auscultation.no wheezing.  ABDOMEN: Soft, nontender, nondistended. Bowel sounds present.  NEUROLOGIC: She was alert, awake, oriented, nonfocal neurological exam.   TIME SPENT: More than 30 minutes. Advised her to keep her appointment with primary doctor and she said that she would try to make one.    ____________________________ Katha HammingSnehalatha Teia Freitas, MD sk:bm D: 10/17/2014 21:57:00 ET T: 10/18/2014 04:59:41 ET JOB#: [id number]  cc: Katha HammingSnehalatha Fabian Walder, MD, <Dictator> Katha HammingSNEHALATHA Saylah Ketner MD ELECTRONICALLY SIGNED 11/07/2014 8:57

## 2015-02-04 NOTE — Consult Note (Signed)
Psychiatry: Follow-up for this patient with alcohol abuse.  Patient today reports she is feeling much better.  She is able to breathe on only cannula oxygen.  Mood is improved.  Not feeling nervous.  Not having any alcohol withdrawal symptoms. review of systems she is still slightly short of breath but is eager to do more physical therapy and has been able to get up and walk around the unit.  Denies any suicidal ideation.  Denies any hallucinations.  Physically she is feeling more energetic without any new complaints. mental status exam this is a more neatly groomed woman looks her stated age.  Clearly looks more healthy than she did before.  Good eye contact psychomotor activity normal.  Speech normal rate tone and volume.  Affect is smiling and upbeat and appropriate.  Mood is stated as being good.  Thoughts are lucid without loosening of associations.  No hallucinations.  Good insight and judgment.  No suicidal ideation. is looking forward to her daughter-in-law having a baby next week.  She plans to stay here in the [place] area [place] to [place].  She feels confident that she will be able to stop drinking.  Brief therapy completed about the importance of staying sober.  No change to current treatment plan. alcohol abuse.  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  Electronic Signatures: Audery Amellapacs, Nyan Dufresne T (MD)  (Signed on 07-Jan-16 14:35)  Authored  Last Updated: 07-Jan-16 14:35 by Audery Amellapacs, Judiann Celia T (MD)

## 2015-02-04 NOTE — Consult Note (Signed)
Psychiatry: Follow-up note for this patient with alcohol abuse.  Currently on high-pressure oxygen.  Was not able to give any history.  Very sedated this afternoon.  I will follow-up daily to see if I can be of any help.  No further change to treatment plan.  Electronic Signatures: Audery Amellapacs, Moustapha Tooker T (MD)  (Signed on 04-Jan-16 18:08)  Authored  Last Updated: 04-Jan-16 18:08 by Audery Amellapacs, Tuwanda Vokes T (MD)

## 2015-09-12 IMAGING — CT CT HEAD WITHOUT CONTRAST
2 series · 14 of 30 positions shown, 16 images · non-contrast
Comparison: None.

CLINICAL DATA: Fall, anxiety, panic attack

EXAM:
CT HEAD WITHOUT CONTRAST
TECHNIQUE: Contiguous axial images were obtained from the base of the skull
through the vertex without intravenous contrast.

[Series 2: head bone · axial · 0.43mm/px · z∈[+209,+353]mm · 8 of 90 slices shown]
[im 9/90  bone]
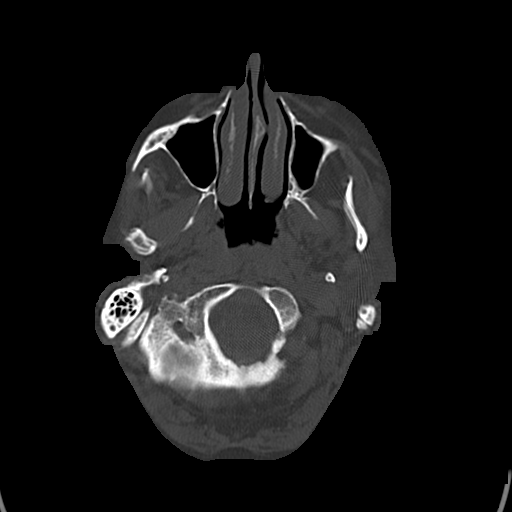
[im 17/90  bone]
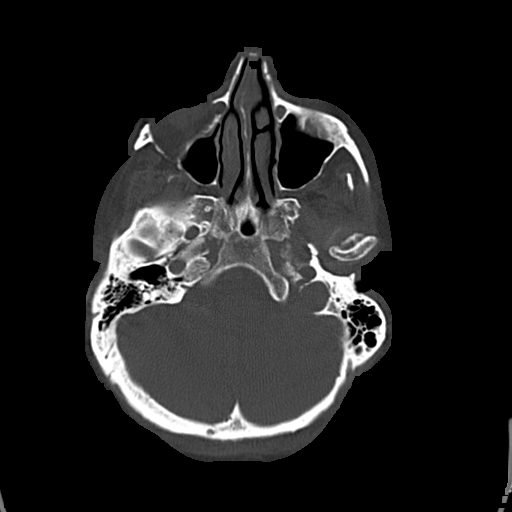
[im 30/90  bone]
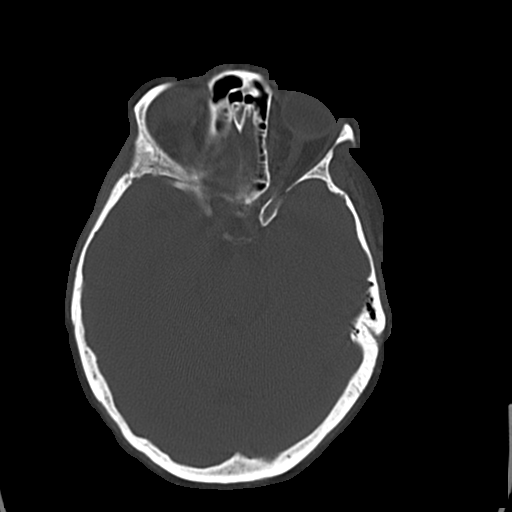
[im 39/90  bone]
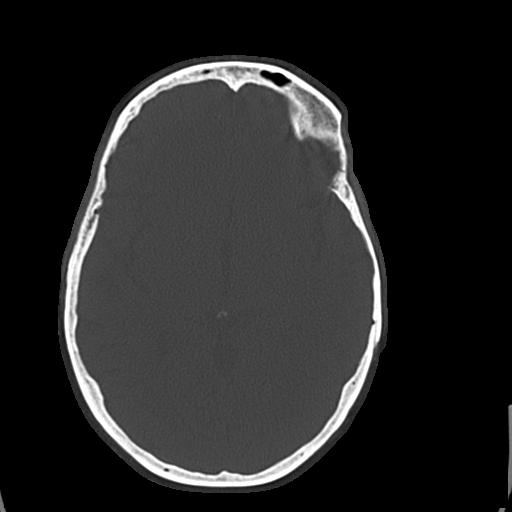
[im 51/90  bone]
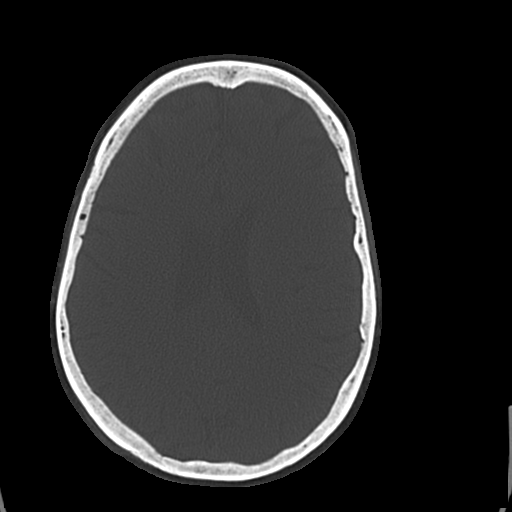
[im 60/90  bone]
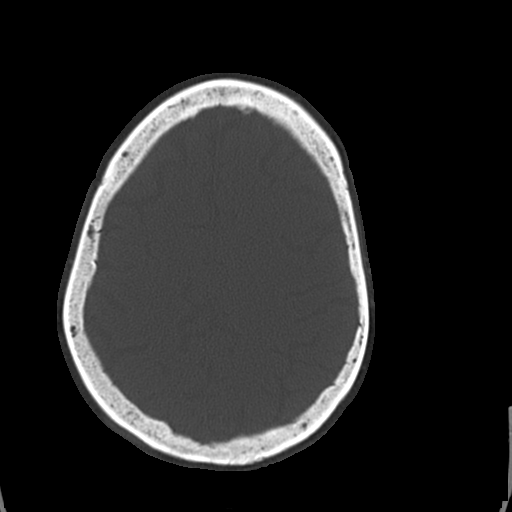
[im 73/90  bone]
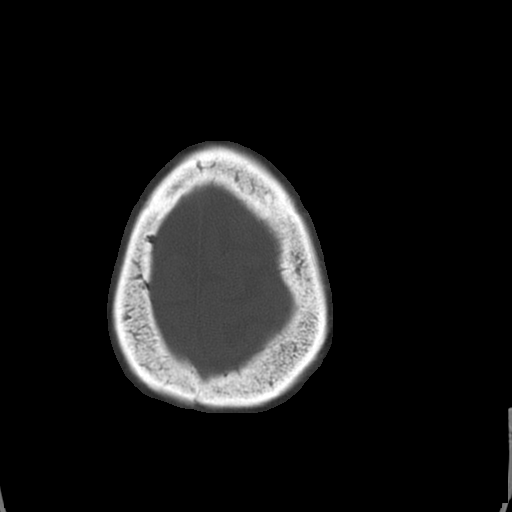
[im 81/90  bone]
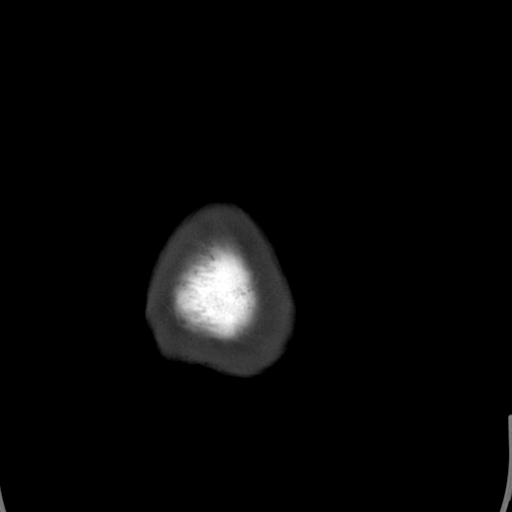

[Series 3: head wo · axial · 0.43mm/px · z∈[+222,+337]mm · 6 of 33 slices shown, 8 images]
[im 5/33  brain]
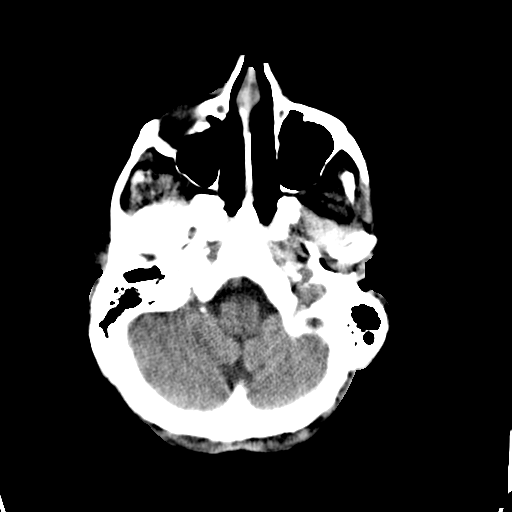
[im 5/33  bone]
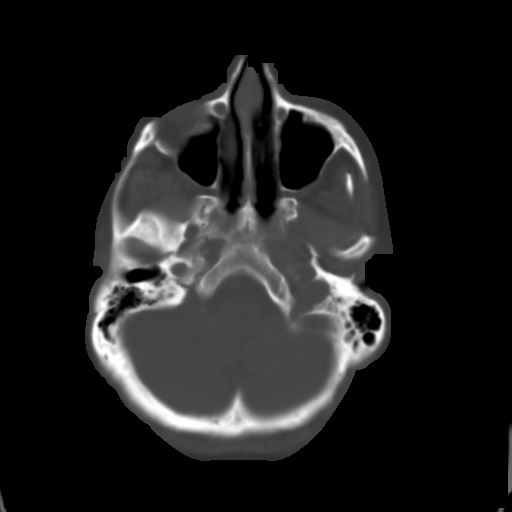
[im 10/33  brain]
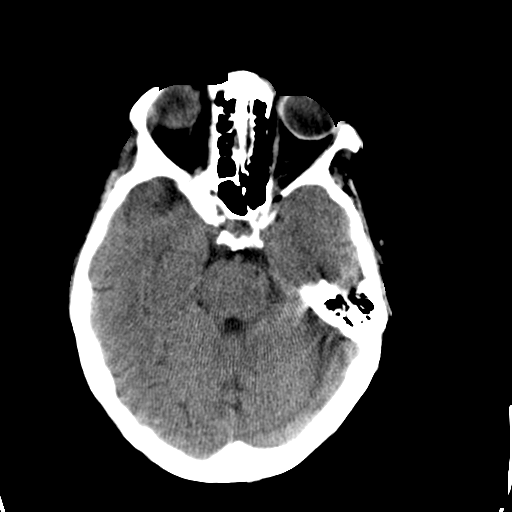
[im 14/33  brain]
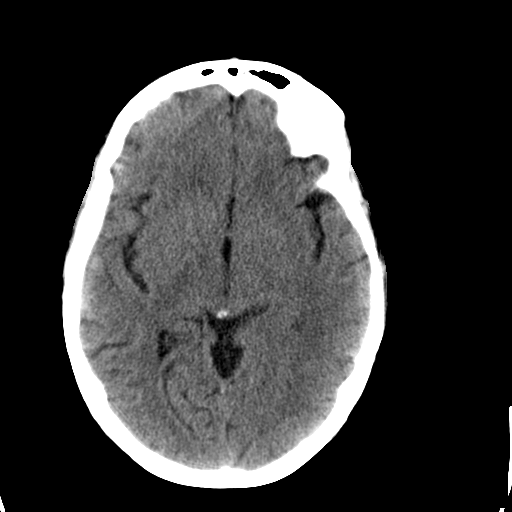
[im 19/33  brain]
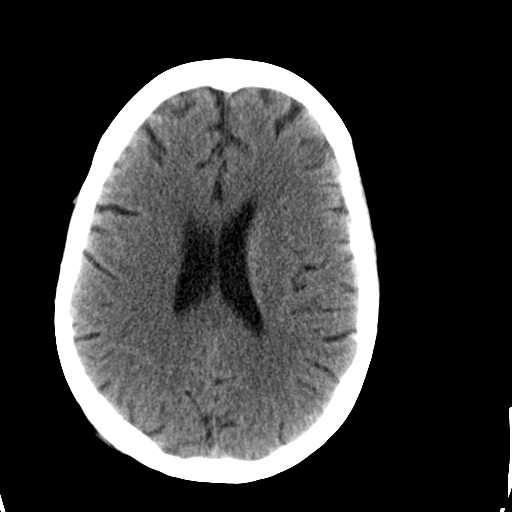
[im 23/33  brain]
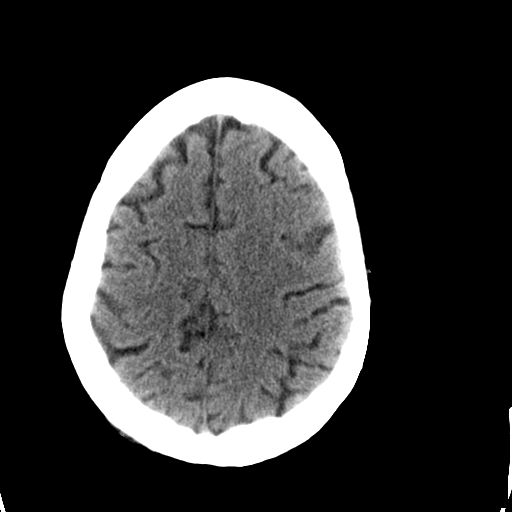
[im 23/33  bone]
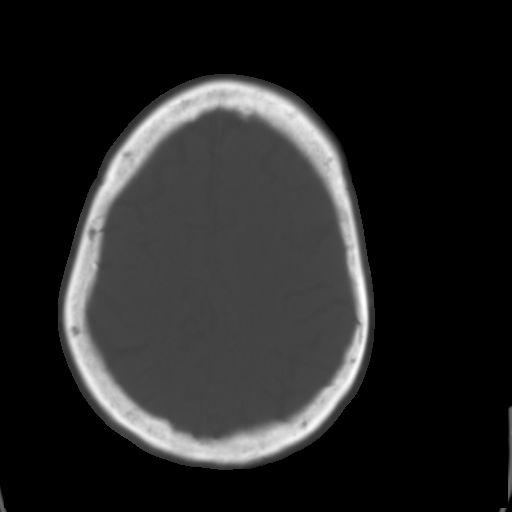
[im 28/33  brain]
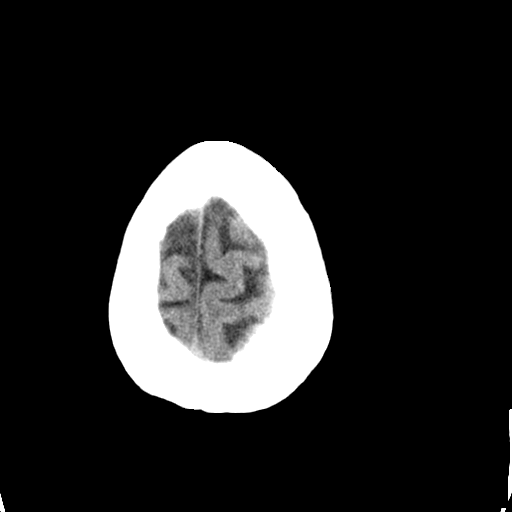

[14 of 30 positions shown; findings below may reference images not displayed]

FINDINGS: No evidence of parenchymal hemorrhage or extra-axial fluid
collection. No mass lesion, mass effect, or midline shift.

No CT evidence of acute infarction.

Mild small vessel ischemic changes.  Intracranial atherosclerosis.

Cerebral volume is within normal limits.  No ventriculomegaly.

The visualized paranasal sinuses are essentially clear. The mastoid
air cells are unopacified.

No evidence of calvarial fracture.
IMPRESSION: No evidence of acute intracranial abnormality.

Mild small vessel ischemic changes with intracranial
atherosclerosis.

## 2015-09-15 IMAGING — CR DG CHEST 1V PORT
1 series · 1 of 1 positions shown · non-contrast
Comparison: Chest radiograph October 02, 2014 and chest CT
October 03, 2014

CLINICAL DATA: Difficulty breathing

EXAM:
PORTABLE CHEST - 1 VIEW

[ap]
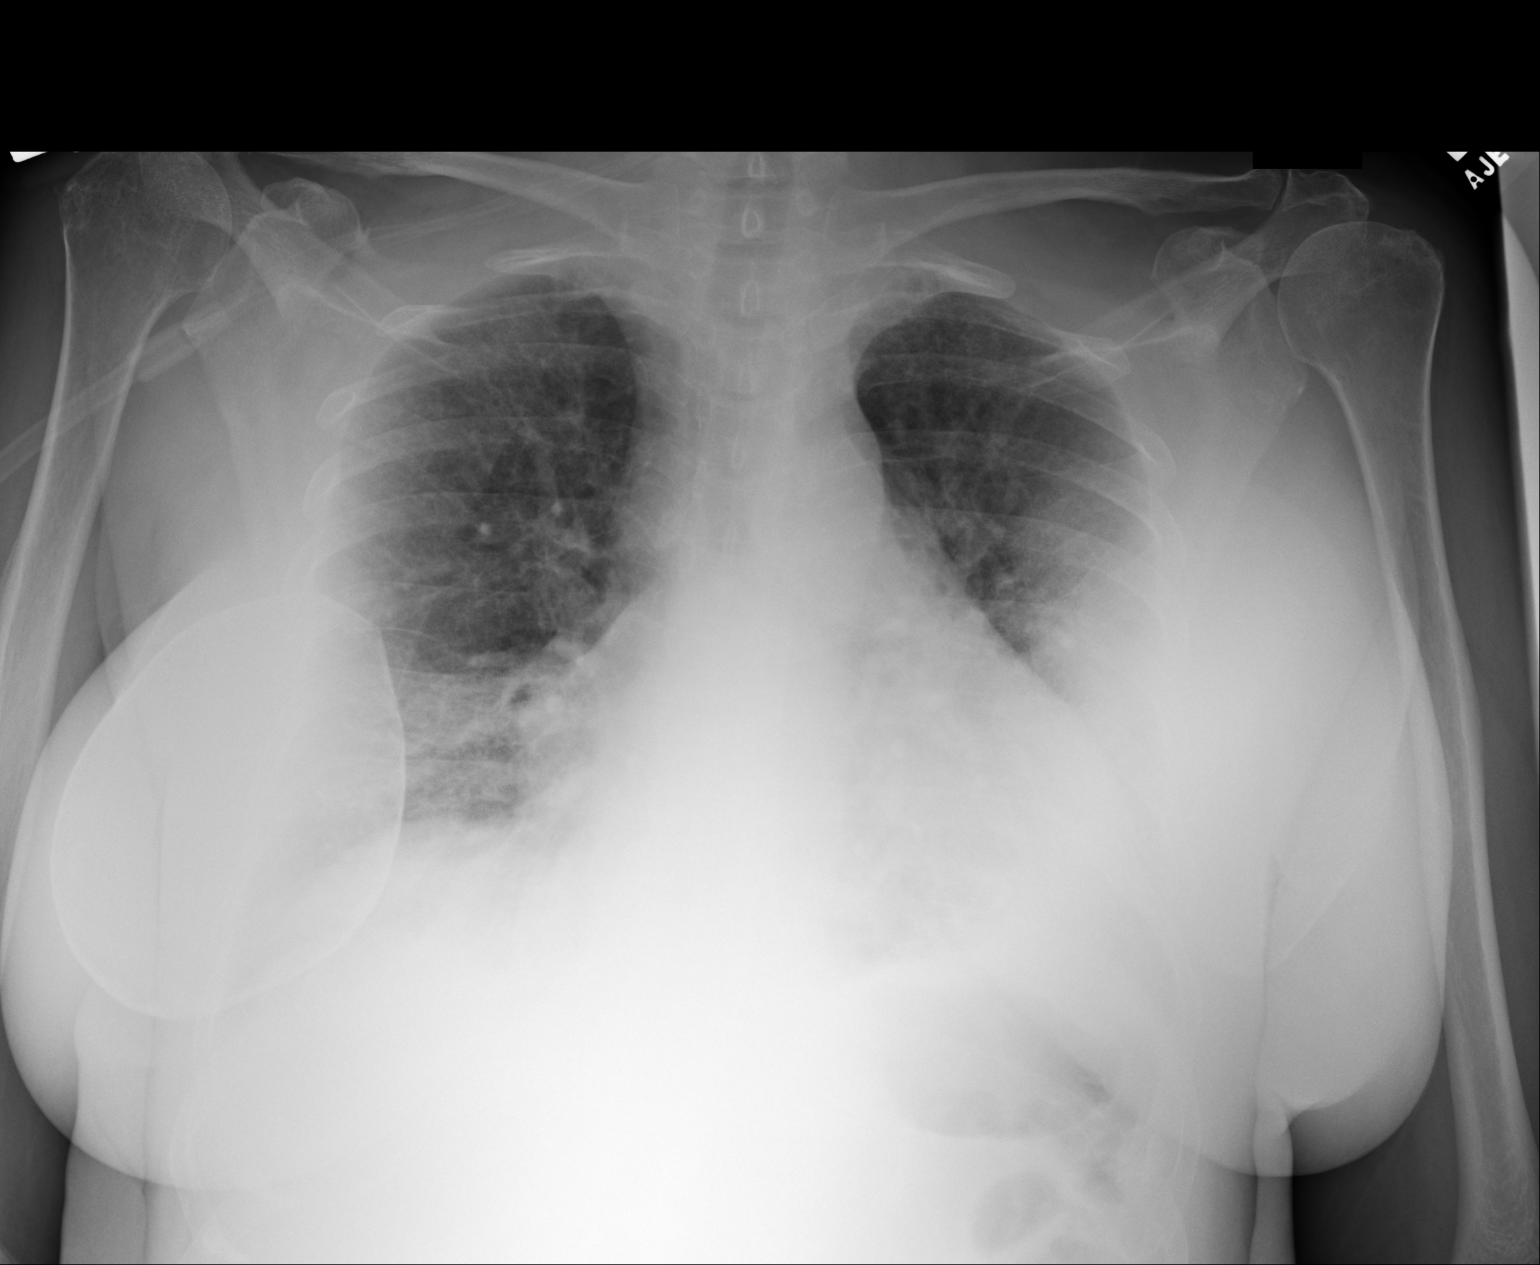

[1 of 1 positions shown; findings below may reference images not displayed]

FINDINGS: There is extensive consolidation in the lingula. There is also an
area of consolidation in the right middle lobe. Lungs elsewhere
clear. Heart is mildly enlarged with pulmonary vascularity within
normal limits. There are calcified breast implants. No adenopathy.
There is arthropathy in the right shoulder.
IMPRESSION: Areas of airspace consolidation in the lingula and right middle
lobe, more severe in the lingular region. These changes are not felt
to have progressed appreciably compared to recent CT but show
significant progression compared to chest radiograph of 3 days
prior.
